# Patient Record
Sex: Male | Born: 1955 | Race: White | Hispanic: No | Smoking: Former smoker
Health system: Southern US, Community
[De-identification: ages and names within clinical notes are randomized; demographics above are authoritative.]

## PROBLEM LIST (undated history)

## (undated) DIAGNOSIS — F329 Major depressive disorder, single episode, unspecified: Secondary | ICD-10-CM

## (undated) DIAGNOSIS — J449 Chronic obstructive pulmonary disease, unspecified: Secondary | ICD-10-CM

## (undated) DIAGNOSIS — Z758 Other problems related to medical facilities and other health care: Secondary | ICD-10-CM

## (undated) DIAGNOSIS — A6000 Herpesviral infection of urogenital system, unspecified: Secondary | ICD-10-CM

## (undated) DIAGNOSIS — G40909 Epilepsy, unspecified, not intractable, without status epilepticus: Secondary | ICD-10-CM

## (undated) DIAGNOSIS — F32A Depression, unspecified: Secondary | ICD-10-CM

## (undated) HISTORY — DX: Major depressive disorder, single episode, unspecified: F32.9

## (undated) HISTORY — PX: VASECTOMY: SHX75

## (undated) HISTORY — DX: Depression, unspecified: F32.A

## (undated) HISTORY — PX: ROTATOR CUFF REPAIR: SHX139

## (undated) HISTORY — DX: Herpesviral infection of urogenital system, unspecified: A60.00

## (undated) HISTORY — DX: Epilepsy, unspecified, not intractable, without status epilepticus: G40.909

## (undated) HISTORY — PX: LITHOTRIPSY: SUR834

## (undated) HISTORY — DX: Other problems related to medical facilities and other health care: Z75.8

---

## 2008-03-22 ENCOUNTER — Emergency Department (HOSPITAL_COMMUNITY): Admission: EM | Admit: 2008-03-22 | Discharge: 2008-03-22 | Payer: Self-pay | Admitting: Emergency Medicine

## 2008-03-24 ENCOUNTER — Ambulatory Visit (HOSPITAL_COMMUNITY): Admission: RE | Admit: 2008-03-24 | Discharge: 2008-03-24 | Payer: Self-pay | Admitting: General Surgery

## 2008-03-24 ENCOUNTER — Encounter (INDEPENDENT_AMBULATORY_CARE_PROVIDER_SITE_OTHER): Payer: Self-pay | Admitting: General Surgery

## 2008-05-08 ENCOUNTER — Emergency Department (HOSPITAL_COMMUNITY): Admission: EM | Admit: 2008-05-08 | Discharge: 2008-05-08 | Payer: Self-pay | Admitting: Emergency Medicine

## 2008-12-13 ENCOUNTER — Encounter: Payer: Self-pay | Admitting: Cardiovascular Disease

## 2010-01-16 ENCOUNTER — Emergency Department (HOSPITAL_COMMUNITY): Admission: EM | Admit: 2010-01-16 | Discharge: 2010-01-16 | Payer: Self-pay | Admitting: Emergency Medicine

## 2011-01-26 LAB — URINE MICROSCOPIC-ADD ON

## 2011-01-26 LAB — URINALYSIS, ROUTINE W REFLEX MICROSCOPIC
Leukocytes, UA: NEGATIVE
Nitrite: NEGATIVE
Specific Gravity, Urine: 1.02 (ref 1.005–1.030)
pH: 6.5 (ref 5.0–8.0)

## 2011-03-17 ENCOUNTER — Emergency Department (HOSPITAL_COMMUNITY): Payer: Medicaid Other

## 2011-03-17 ENCOUNTER — Emergency Department (HOSPITAL_COMMUNITY)
Admission: EM | Admit: 2011-03-17 | Discharge: 2011-03-17 | Disposition: A | Discharge status: Home / Self Care | Payer: Medicaid Other | Attending: Emergency Medicine | Admitting: Emergency Medicine

## 2011-03-17 DIAGNOSIS — R319 Hematuria, unspecified: Secondary | ICD-10-CM | POA: Insufficient documentation

## 2011-03-17 DIAGNOSIS — R109 Unspecified abdominal pain: Secondary | ICD-10-CM | POA: Insufficient documentation

## 2011-03-17 DIAGNOSIS — M549 Dorsalgia, unspecified: Secondary | ICD-10-CM | POA: Insufficient documentation

## 2011-03-17 DIAGNOSIS — N200 Calculus of kidney: Secondary | ICD-10-CM | POA: Insufficient documentation

## 2011-03-17 LAB — DIFFERENTIAL
Basophils Relative: 0 % (ref 0–1)
Lymphs Abs: 2.1 10*3/uL (ref 0.7–4.0)
Monocytes Absolute: 1 10*3/uL (ref 0.1–1.0)
Monocytes Relative: 13 % — ABNORMAL HIGH (ref 3–12)
Neutro Abs: 4.3 10*3/uL (ref 1.7–7.7)

## 2011-03-17 LAB — COMPREHENSIVE METABOLIC PANEL
BUN: 11 mg/dL (ref 6–23)
CO2: 29 mEq/L (ref 19–32)
Calcium: 10 mg/dL (ref 8.4–10.5)
Chloride: 104 mEq/L (ref 96–112)
Creatinine, Ser: 0.81 mg/dL (ref 0.4–1.5)
GFR calc Af Amer: >60 mL/min (ref >60)
GFR calc non Af Amer: >60 mL/min (ref >60)
Glucose, Bld: 109 mg/dL — ABNORMAL HIGH (ref 70–99)
Total Bilirubin: 0.6 mg/dL (ref 0.3–1.2)

## 2011-03-17 LAB — URINALYSIS, ROUTINE W REFLEX MICROSCOPIC
Bilirubin Urine: NEGATIVE
Glucose, UA: NEGATIVE mg/dL
Hgb urine dipstick: NEGATIVE
Specific Gravity, Urine: 1.005 (ref 1.005–1.030)
Urobilinogen, UA: 0.2 mg/dL (ref 0.0–1.0)

## 2011-03-17 LAB — CBC
HCT: 40.8 % (ref 39.0–52.0)
Hemoglobin: 13.9 g/dL (ref 13.0–17.0)
MCH: 30.6 pg (ref 26.0–34.0)
MCHC: 34.1 g/dL (ref 30.0–36.0)
MCV: 89.9 fL (ref 78.0–100.0)

## 2011-03-17 LAB — LIPASE, BLOOD: Lipase: 36 U/L (ref 11–59)

## 2011-03-18 NOTE — Op Note (Signed)
NAME:  Johnathan Barber, Johnathan Barber NO.:  [id number]   MEDICAL RECORD NO.:  [id number]          PATIENT TYPE:  AMB   LOCATION:  DAY                           FACILITY:  [facility]   PHYSICIAN:  Tilford Pillar, MD      DATE OF BIRTH:  Jan 19, 1956   DATE OF PROCEDURE:  03/24/2008  DATE OF DISCHARGE:                               OPERATIVE REPORT   POSTOPERATIVE DIAGNOSIS:  Lymphadenopathy.   POSTOPERATIVE DIAGNOSIS:  Lymphadenopathy.   PROCEDURE:  Right inguinal lymph node biopsy.   SURGEON:  Tilford Pillar, MD   ANESTHESIA:  General endotracheal, local anesthetic 1% lidocaine plain.   SPECIMEN:  Inguinal lymph nodes x2.   ESTIMATED BLOOD LOSS:  Scant.   INDICATIONS:  The patient is a 55 year old who presented to my office on  referral from the emergency department by Dr. Mariel Sleet for noted  history of cervical and inguinal lymphadenopathy.  He does not have a  significant family history of hematologic cancers or disorders that he  is aware of.  He has had a recent history over the last month with a  tick bite and associated rash, but otherwise his history is relatively  unremarkable for possibility of allergies or lymphadenopathy.  He has  complained of decreased energy, but has not had any significant weight  loss over the last month and a half during the presence of this  pronounced cervical and inguinal lymphadenopathy.  Based on this, it was  recommended that a lymph node biopsy be performed.  The risks, benefits,  and alternatives of a right inguinal lymph node biopsy were discussed in  length with the patient including, but not limited to, risk of bleeding,  infection, as well as the possibility of an intraoperative cardiac or  pulmonary events.  The patient's questions and concerns were addressed.  The patient consented for the planned procedure.   OPERATION IN DETAIL:  The patient was taken to the operating room and  placed in a supine position on the operating room  table, at which time  general anesthetic was administered.  Once the patient was successfully  endotracheally intubated by Anesthesia.  At this point, the patient's  right groin was prepped and draped in the usual fashion.  Skin incision  was made with a scalpel.  Additional dissection down through the  subcuticular tissue was carried out using electrocautery.  I did then  enter into the Scarpa's fascia and identified several enlarged inguinal  lymph nodes.  Combination of blunt and sharp dissection as well as  electrocautery dissection was utilized to dissect the lymph nodes free.  Two separate lymph nodes were obtained.  We will send this fresh  specimen to pathology.  At this point, the wound was irrigated.  Hemostasis was obtained with electrocautery.  The Scarpa's fascia was  reapproximated using a 3-0 Vicryl in running continuous fashion.  Local  anesthetic was then instilled.  The skin edges were reapproximated using  a 4-0 Monocryl in a running subcuticular suture.  The skin was washed  and dried with a moist and dry towel.  Benzoin was applied around  the  incision.  Half-inch Steri-Strips were placed.  Drapes  were removed.  The patient was allowed to come out of the general  anesthetic and was transferred back to regular hospital bed.  He was  transferred to the Post-Anesthetic Care Unit in a stable condition.  At  the conclusion of the procedure, all instrument, sponge, and needle  counts were correct.  The patient tolerated the procedure well.      Tilford Pillar, MD  Electronically Signed     BZ/MEDQ  D:  03/24/2008  T:  03/25/2008  Job:  [id number]   cc:   Ladona Horns. Mariel Sleet, MD  Fax: [phone]
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
## 2011-07-30 LAB — DIFFERENTIAL
Basophils Relative: 1
Eosinophils Absolute: 0.2
Eosinophils Relative: 4
Lymphs Abs: 1.9
Monocytes Relative: 7
Neutrophils Relative %: 50

## 2011-07-30 LAB — COMPREHENSIVE METABOLIC PANEL
ALT: 11
AST: 14
Alkaline Phosphatase: 97
CO2: 27
Calcium: 9.1
GFR calc Af Amer: >60
GFR calc non Af Amer: >60
Glucose, Bld: 99
Potassium: 4.4
Sodium: 138

## 2011-07-30 LAB — CBC
Hemoglobin: 14.4
RBC: 4.51
WBC: 5

## 2011-07-30 LAB — BETA 2 MICROGLOBULIN, SERUM: Beta-2 Microglobulin: 1.27

## 2011-07-30 LAB — LACTATE DEHYDROGENASE: LDH: 101

## 2013-12-22 DIAGNOSIS — N200 Calculus of kidney: Secondary | ICD-10-CM | POA: Insufficient documentation

## 2017-01-06 ENCOUNTER — Encounter: Payer: Self-pay | Admitting: Cardiovascular Disease

## 2017-01-26 ENCOUNTER — Encounter: Payer: Self-pay | Admitting: *Deleted

## 2017-01-27 ENCOUNTER — Telehealth: Payer: Self-pay | Admitting: Cardiovascular Disease

## 2017-01-27 ENCOUNTER — Ambulatory Visit (INDEPENDENT_AMBULATORY_CARE_PROVIDER_SITE_OTHER): Payer: Medicaid Other | Admitting: Cardiovascular Disease

## 2017-01-27 ENCOUNTER — Encounter: Payer: Self-pay | Admitting: Cardiovascular Disease

## 2017-01-27 VITALS — BP 135/86 | HR 60 | Ht 67.0 in | Wt 129.0 lb

## 2017-01-27 DIAGNOSIS — R002 Palpitations: Secondary | ICD-10-CM | POA: Diagnosis not present

## 2017-01-27 DIAGNOSIS — R079 Chest pain, unspecified: Secondary | ICD-10-CM

## 2017-01-27 DIAGNOSIS — Z720 Tobacco use: Secondary | ICD-10-CM | POA: Diagnosis not present

## 2017-01-27 DIAGNOSIS — R55 Syncope and collapse: Secondary | ICD-10-CM | POA: Diagnosis not present

## 2017-01-27 DIAGNOSIS — G4489 Other headache syndrome: Secondary | ICD-10-CM | POA: Diagnosis not present

## 2017-01-27 NOTE — Telephone Encounter (Signed)
CT of HEad scheduled for March 29 arrive at 645 pm [facility]

## 2017-01-27 NOTE — Telephone Encounter (Signed)
Left message for patient to call back to get appointment for CT scheduled for march 29 arrive at 645 pm appointment at [facility]
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
## 2017-01-27 NOTE — Patient Instructions (Addendum)
Medication Instructions:  Continue all current medications.  Labwork: BMET - order given today.    Testing/Procedures:  Your physician has recommended that you wear a 14 day event monitor. Event monitors are medical devices that record the heart's electrical activity. Doctors most often us these monitors to diagnose arrhythmias. Arrhythmias are problems with the speed or rhythm of the heartbeat. The monitor is a small, portable device. You can wear one while you do your normal daily activities. This is usually used to diagnose what is causing palpitations/syncope (passing out).  (Head CT) Non-Cardiac CT scanning, (CAT scanning), is a noninvasive, special x-ray that produces cross-sectional images of the body using x-rays and a computer. CT scans help physicians diagnose and treat medical conditions. For some CT exams, a contrast material is used to enhance visibility in the area of the body being studied. CT scans provide greater clarity and reveal more details than regular x-ray exams.  Office will contact with results via phone or letter.    Follow-Up: 2 months   Any Other Special Instructions Will Be Listed Below (If Applicable).  If you need a refill on your cardiac medications before your next appointment, please call your pharmacy.

## 2017-01-28 ENCOUNTER — Telehealth: Payer: Self-pay | Admitting: *Deleted

## 2017-01-28 NOTE — Addendum Note (Signed)
Addended by: Burman NievesASHWORTH, Nhia Heaphy T on: 01/28/2017 07:34 AM   Modules accepted: Orders

## 2017-01-28 NOTE — Telephone Encounter (Signed)
Notes recorded by Lesle ChrisAngela G Carloyn Lahue, LPN on 9/14/78293/28/2018 at 3:56 PM EDT Left message to return call.  ------  Notes recorded by Laqueta LindenSuresh A Koneswaran, MD on 01/28/2017 at 12:35 PM EDT Good.

## 2017-01-29 ENCOUNTER — Ambulatory Visit (HOSPITAL_COMMUNITY)
Admission: RE | Admit: 2017-01-29 | Discharge: 2017-01-29 | Disposition: A | Discharge status: Home / Self Care | Payer: Medicaid Other | Source: Ambulatory Visit | Attending: Cardiovascular Disease | Admitting: Cardiovascular Disease

## 2017-01-29 ENCOUNTER — Encounter: Payer: Self-pay | Admitting: *Deleted

## 2017-01-29 DIAGNOSIS — G4489 Other headache syndrome: Secondary | ICD-10-CM | POA: Diagnosis present

## 2017-01-29 DIAGNOSIS — R55 Syncope and collapse: Secondary | ICD-10-CM | POA: Diagnosis not present

## 2017-01-29 NOTE — Telephone Encounter (Signed)
Notes recorded by Ravin Denardo G Aikam Vinje, LPN on 1/61/09603/29/2018 atLesle Chris 5:28 PM EDT Called patient again for test results. Mother answered phone, but did not see designated release with her name on it. Patient going tonight for his head CT. Will mail letter in regards to normal labs. ------  Notes recorded by Lesle ChrisAngela G Kinsley Holderman, LPN on 4/54/09813/28/2018 at 3:56 PM EDT Left message to return call.  ------  Notes recorded by Laqueta LindenSuresh A Koneswaran, MD on 01/28/2017 at 12:35 PM EDT Good.

## 2017-01-29 NOTE — Telephone Encounter (Signed)
Johnathan Barber (mother) returned call to [place].

## 2017-02-02 ENCOUNTER — Telehealth: Payer: Self-pay | Admitting: *Deleted

## 2017-02-02 NOTE — Telephone Encounter (Signed)
Notes recorded by Lesle Chris, LPN on 11/08/1094 at 8:43 AM EDT Left message to return call with mother.   ------  Notes recorded by Laqueta Linden, MD on 01/30/2017 at 9:59 AM EDT normal

## 2017-02-02 NOTE — Telephone Encounter (Signed)
Notes recorded by Lesle Chris, LPN on 05/11/2955 at 4:54 PM EDT Patient notified. Copy to pmd.

## 2017-02-04 ENCOUNTER — Telehealth: Payer: Self-pay | Admitting: Cardiovascular Disease

## 2017-02-04 NOTE — Telephone Encounter (Signed)
Checked [employer] site, but will check with nurse to clarify

## 2017-02-04 NOTE — Telephone Encounter (Signed)
Has not received his monitor   He would like to know where it is.

## 2017-02-05 NOTE — Telephone Encounter (Signed)
Delores (mother) notified.  Order placed.  Should be hearing from [employer] in a few days.

## 2017-02-13 ENCOUNTER — Ambulatory Visit (INDEPENDENT_AMBULATORY_CARE_PROVIDER_SITE_OTHER): Payer: Medicaid Other

## 2017-02-13 DIAGNOSIS — R002 Palpitations: Secondary | ICD-10-CM | POA: Diagnosis not present

## 2017-03-10 ENCOUNTER — Ambulatory Visit: Payer: Medicaid Other | Admitting: Neurology

## 2017-03-18 ENCOUNTER — Encounter: Payer: Self-pay | Admitting: *Deleted

## 2017-03-31 ENCOUNTER — Encounter: Payer: Self-pay | Admitting: Cardiovascular Disease

## 2017-03-31 ENCOUNTER — Ambulatory Visit: Payer: Medicaid Other | Admitting: Cardiovascular Disease

## 2017-06-14 IMAGING — CT CT HEAD W/O CM
3 series · 16 of 47 positions shown, 19 images · non-contrast
Comparison: None.

CLINICAL DATA: Headaches

EXAM:
CT HEAD WITHOUT CONTRAST
TECHNIQUE: Contiguous axial images were obtained from the base of the skull
through the vertex without intravenous contrast.

[Series 2: head wo · axial · 0.43mm/px · z∈[+133,+268]mm · 10 of 33 slices shown, 13 images]
[im 3/33  brain]
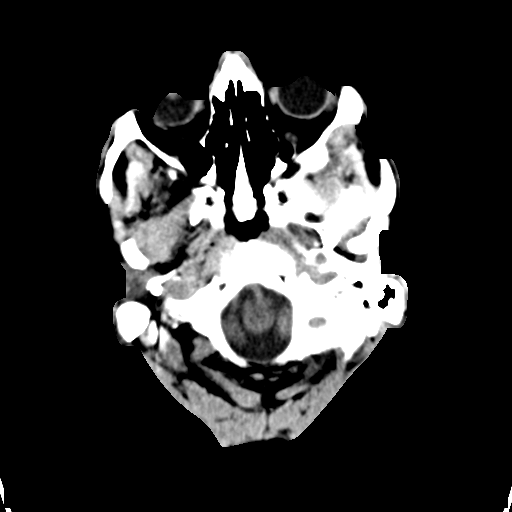
[im 3/33  bone]
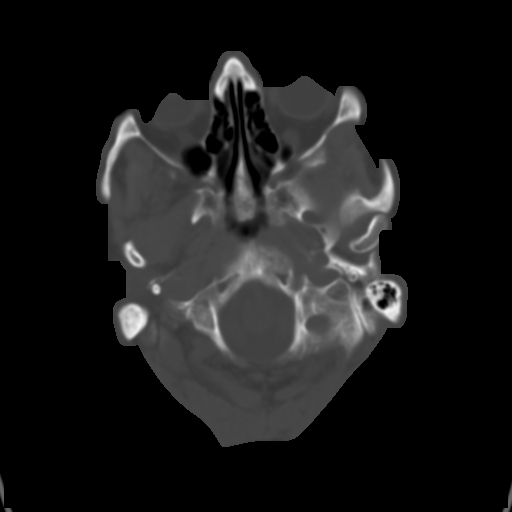
[im 6/33  brain]
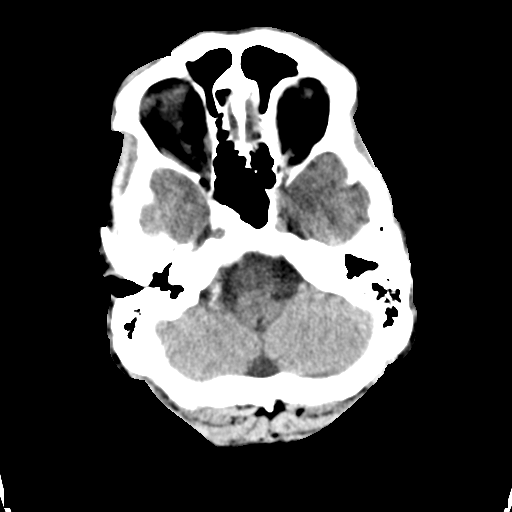
[im 9/33  brain]
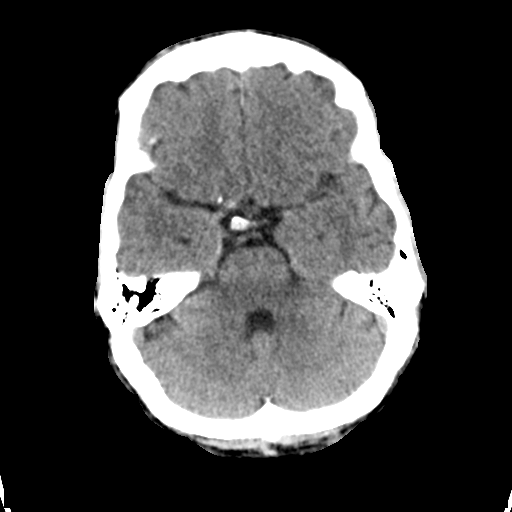
[im 12/33  brain]
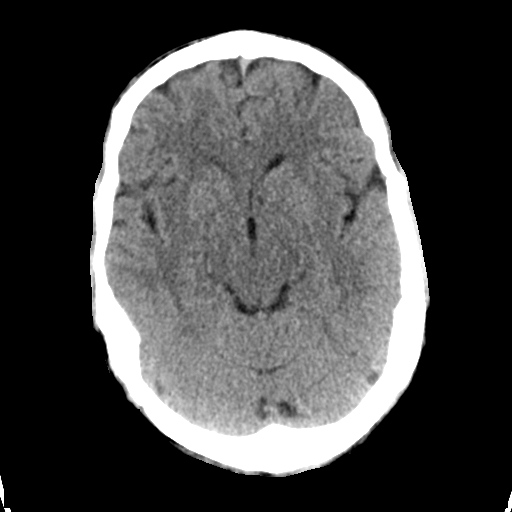
[im 15/33  brain]
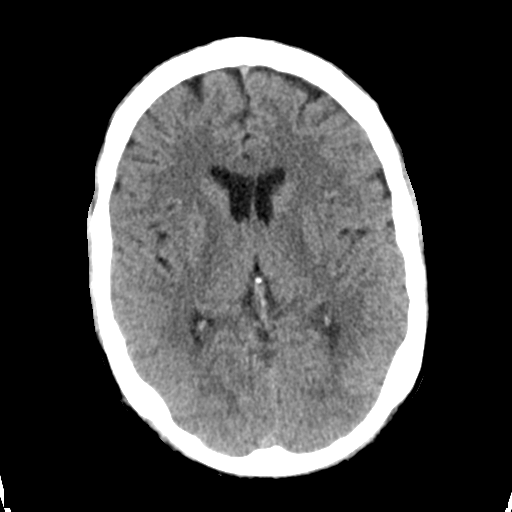
[im 15/33  bone]
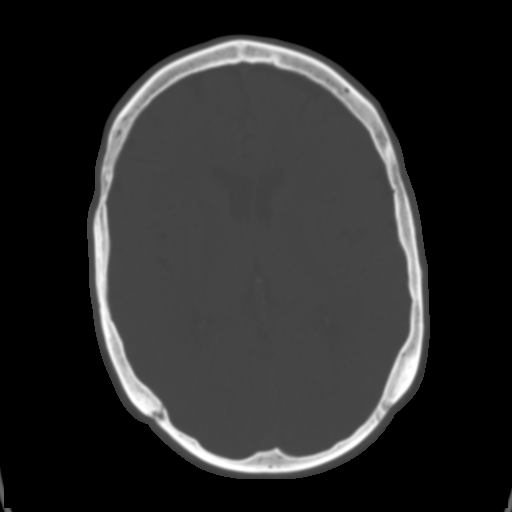
[im 18/33  brain]
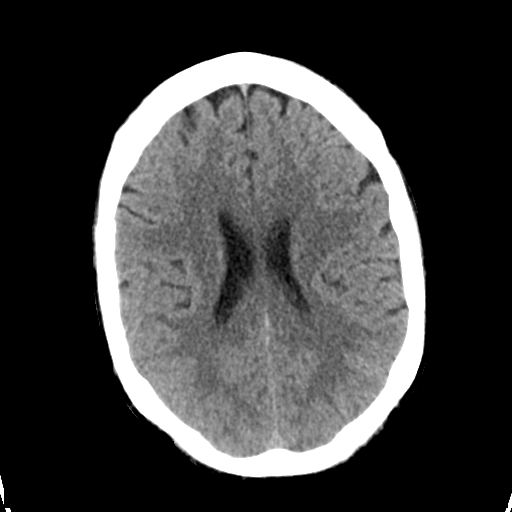
[im 21/33  brain]
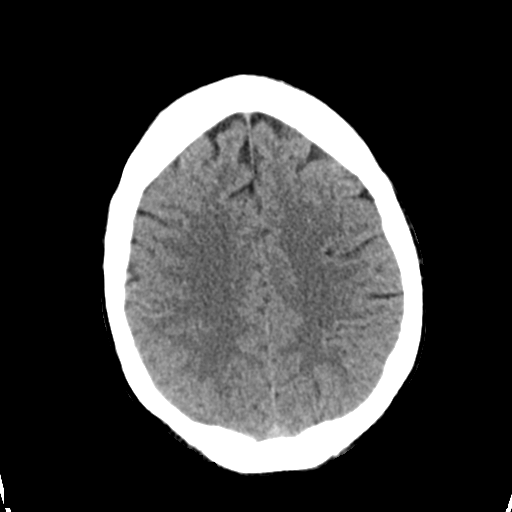
[im 25/33  brain]
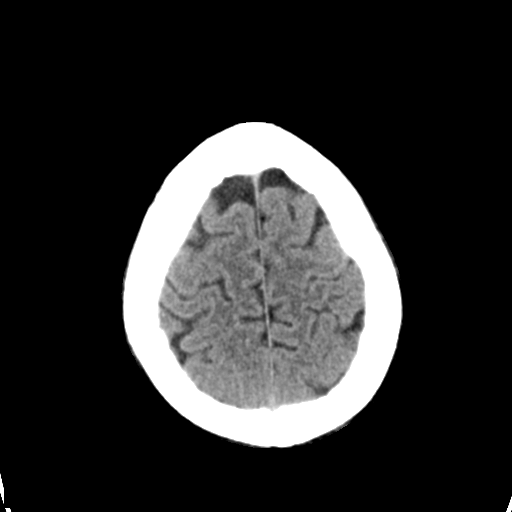
[im 27/33  brain]
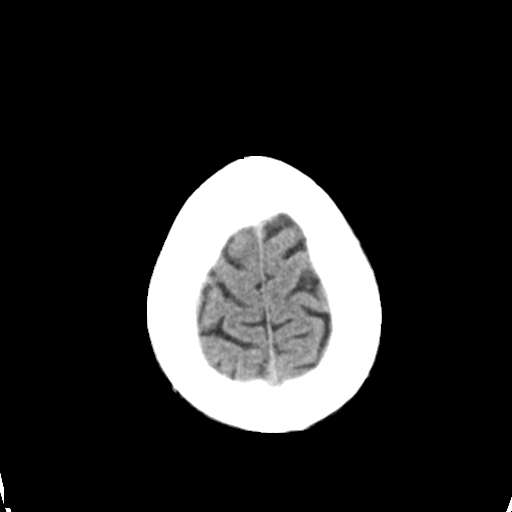
[im 27/33  bone]
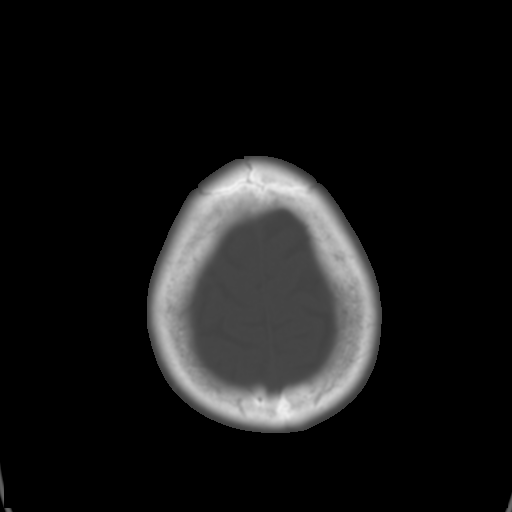
[im 30/33  brain]
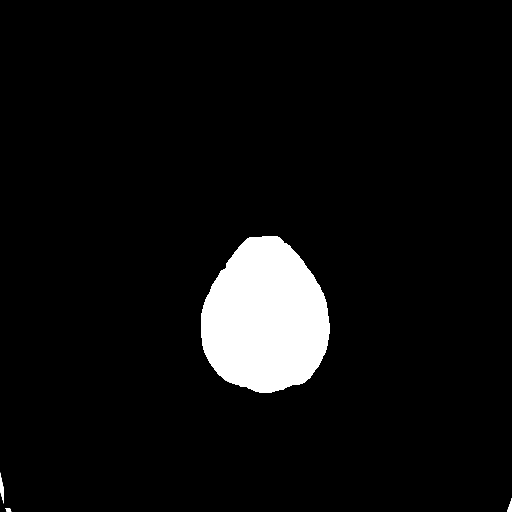

[Series 4: coronal soft tissue · coronal · 0.31mm/px · 3 of 67 slices shown]
[im 23/67  brain]
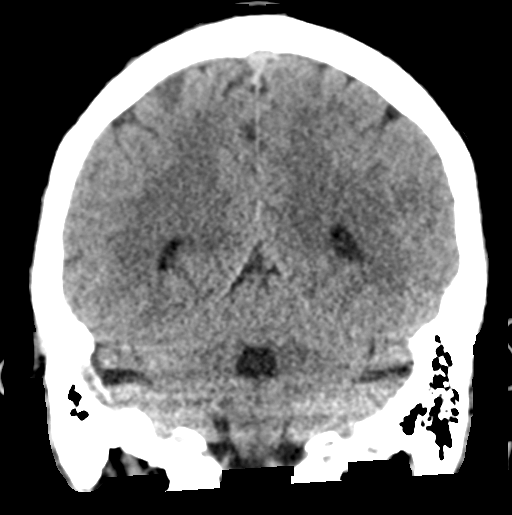
[im 30/67  brain]
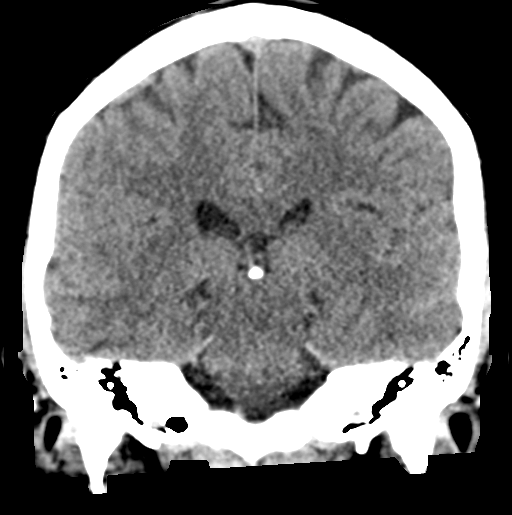
[im 37/67  brain]
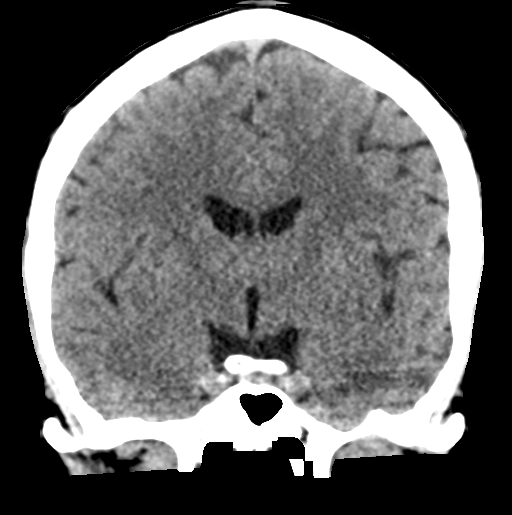

[Series 5: sagittal soft tissue · sagittal · 0.30mm/px · 3 of 50 slices shown]
[im 17/50  brain]
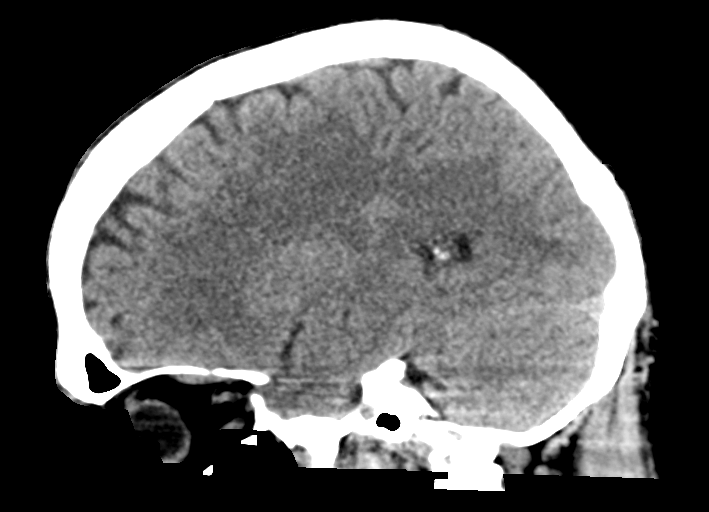
[im 25/50  brain]
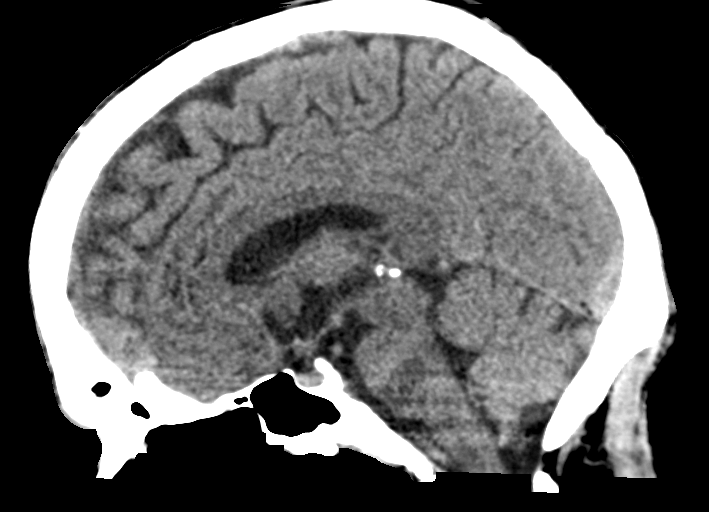
[im 33/50  brain]
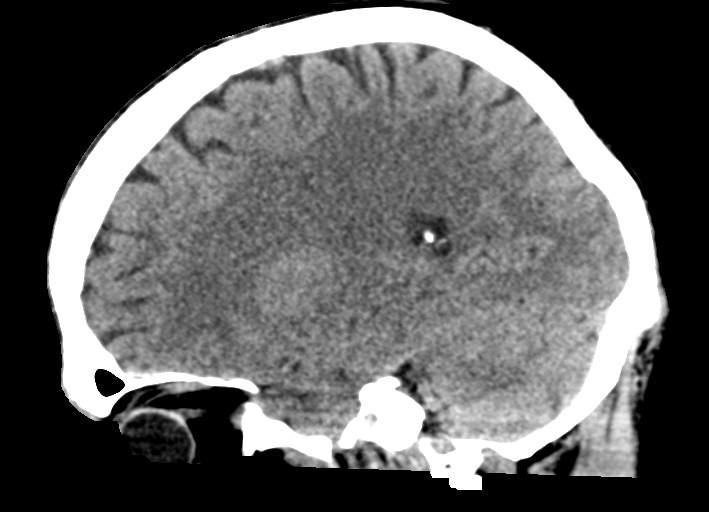

[16 of 47 positions shown; findings below may reference images not displayed]

FINDINGS: Brain: No evidence of acute infarction, hemorrhage, hydrocephalus,
extra-axial collection or mass lesion/mass effect.

Vascular: No hyperdense vessel or unexpected calcification.

Skull: Normal. Negative for fracture or focal lesion.

Sinuses/Orbits: No acute finding.

Other: None.
IMPRESSION: No acute intracranial abnormality noted.

## 2018-10-08 DIAGNOSIS — Z720 Tobacco use: Secondary | ICD-10-CM | POA: Insufficient documentation

## 2018-10-08 DIAGNOSIS — K920 Hematemesis: Secondary | ICD-10-CM | POA: Insufficient documentation

## 2022-11-04 ENCOUNTER — Encounter (HOSPITAL_COMMUNITY): Payer: Self-pay | Admitting: Emergency Medicine

## 2022-11-04 ENCOUNTER — Other Ambulatory Visit: Payer: Self-pay

## 2022-11-04 ENCOUNTER — Emergency Department (HOSPITAL_COMMUNITY)
Admission: EM | Admit: 2022-11-04 | Discharge: 2022-11-04 | Disposition: A | Discharge status: Home / Self Care | Payer: Medicare Other | Attending: Emergency Medicine | Admitting: Emergency Medicine

## 2022-11-04 ENCOUNTER — Emergency Department (HOSPITAL_COMMUNITY): Payer: Medicare Other

## 2022-11-04 DIAGNOSIS — Z7901 Long term (current) use of anticoagulants: Secondary | ICD-10-CM | POA: Diagnosis not present

## 2022-11-04 DIAGNOSIS — R079 Chest pain, unspecified: Secondary | ICD-10-CM | POA: Insufficient documentation

## 2022-11-04 DIAGNOSIS — J449 Chronic obstructive pulmonary disease, unspecified: Secondary | ICD-10-CM | POA: Insufficient documentation

## 2022-11-04 DIAGNOSIS — Z87891 Personal history of nicotine dependence: Secondary | ICD-10-CM | POA: Insufficient documentation

## 2022-11-04 HISTORY — DX: Chronic obstructive pulmonary disease, unspecified: J44.9

## 2022-11-04 LAB — BASIC METABOLIC PANEL
Anion gap: 9 (ref 5–15)
BUN: 11 mg/dL (ref 8–23)
CO2: 25 mmol/L (ref 22–32)
Calcium: 9.1 mg/dL (ref 8.9–10.3)
Chloride: 102 mmol/L (ref 98–111)
Creatinine, Ser: 0.92 mg/dL (ref 0.61–1.24)
GFR, Estimated: >60 mL/min (ref >60)
Glucose, Bld: 98 mg/dL (ref 70–99)
Potassium: 4 mmol/L (ref 3.5–5.1)
Sodium: 136 mmol/L (ref 135–145)

## 2022-11-04 LAB — D-DIMER, QUANTITATIVE: D-Dimer, Quant: <0.27 ug/mL-FEU (ref 0.00–0.50)

## 2022-11-04 LAB — CBC
HCT: 43.9 % (ref 39.0–52.0)
Hemoglobin: 15.2 g/dL (ref 13.0–17.0)
MCH: 31 pg (ref 26.0–34.0)
MCHC: 34.6 g/dL (ref 30.0–36.0)
MCV: 89.6 fL (ref 80.0–100.0)
Platelets: 244 10*3/uL (ref 150–400)
RBC: 4.9 MIL/uL (ref 4.22–5.81)
RDW: 13.8 % (ref 11.5–15.5)
WBC: 6.6 10*3/uL (ref 4.0–10.5)
nRBC: 0 % (ref 0.0–0.2)

## 2022-11-04 LAB — TROPONIN I (HIGH SENSITIVITY)
Troponin I (High Sensitivity): 2 ng/L (ref <18)
Troponin I (High Sensitivity): 3 ng/L (ref <18)

## 2022-11-04 NOTE — ED Triage Notes (Signed)
Pt sent by PCP for chest pain x 2 days, pt c/o left sided chest pain with "electrical shock pain to left side and left arm", right sided chest pain that feels like stabbing pain

## 2022-11-04 NOTE — ED Provider Notes (Signed)
[facility] Provider Note   CSN: [id number] Arrival date & time: 11/04/22  1023     History  Chief Complaint  Patient presents with   Chest Pain    Johnathan Barber is a 67 y.o. male.  67 year old male with a history of prior tobacco use, COPD not on home oxygen, and family history of MI who presents emergency department with chest pain.  Patient describes left-sided electric shocklike sensation in his left chest.  Says that yesterday it was also associated with some shortness of breath and a stabbing sensation in his right chest.  Says that the sensation improves with exertion.  Did have an episode of vomiting with it last night.  No diaphoresis.  Reports that his left-sided chest pain is pleuritic.  Does radiate down his left arm.  Unsure if it is from his neck but does have a history of degenerative disc disease.  Reports that father had an MI before the age of 59.  Had a stress test 1 year ago that was normal.  Is currently undergoing a workup for right lung mass that they are concerned about possible lung cancer with.  Denies any fever or worsening cough.       Home Medications Prior to Admission medications   Medication Sig Start Date End Date Taking? Authorizing Provider  rivaroxaban (XARELTO) 20 MG TABS tablet Take 20 mg by mouth daily with supper. Patient not taking: Reported on 11/04/2022    [provider]      Allergies    Codeine, Morphine, and Prednisone    Review of Systems   Review of Systems  Physical Exam Updated Vital Signs BP (!) 166/84 (BP Location: Right Arm)   Pulse 72   Temp 98.6 F (37 C) (Oral)   Resp 15   Ht 5\' 7"  (1.702 m)   Wt 60.6 kg   SpO2 100%   BMI 20.94 kg/m  Physical Exam Vitals and nursing note reviewed.  Constitutional:      General: He is not in acute distress.    Appearance: He is well-developed.  HENT:     Head: Normocephalic and atraumatic.     Right Ear: External ear normal.     Left Ear:  External ear normal.     Nose: Nose normal.  Eyes:     Extraocular Movements: Extraocular movements intact.     Conjunctiva/sclera: Conjunctivae normal.     Pupils: Pupils are equal, round, and reactive to light.  Cardiovascular:     Rate and Rhythm: Normal rate and regular rhythm.     Heart sounds: Normal heart sounds.     Comments: No rash on chest Pulmonary:     Effort: Pulmonary effort is normal. No respiratory distress.     Comments: Very faint expiratory wheezing.  Patient speaking in full sentences without difficulty.  No labored breathing. Abdominal:     General: There is no distension.     Palpations: Abdomen is soft. There is no mass.     Tenderness: There is no abdominal tenderness. There is no guarding.  Musculoskeletal:     Cervical back: Normal range of motion and neck supple.     Right lower leg: No edema.     Left lower leg: No edema.     Comments: Symmetrically palpable radial and ulnar pulses. Capillary refill <2 seconds to all digits.  Intact sensation to light Barber of the radial, median and ulnar nerves demonstrated by testing in the dorsal web  space of the thumb, the hypothenar eminence of the palm, and the radial aspect of the dorsum of the hand.  Intact motor function of the radial, median and ulnar nerves demonstrated by strength of hand grip, and spreading of the 2nd through 5th digits, thumb apposition, and ability to make "OK sign".  Skin:    General: Skin is warm and dry.  Neurological:     Mental Status: He is alert. Mental status is at baseline.  Psychiatric:        Mood and Affect: Mood normal.        Behavior: Behavior normal.     ED Results / Procedures / Treatments   Labs (all labs ordered are listed, but only abnormal results are displayed) Labs Reviewed  BASIC METABOLIC PANEL  CBC  D-DIMER, QUANTITATIVE  TROPONIN I (HIGH SENSITIVITY)  TROPONIN I (HIGH SENSITIVITY)    EKG EKG Interpretation  Date/Time:  Tuesday November 04 2022  11:18:05 EST Ventricular Rate:  83 PR Interval:  156 QRS Duration: 90 QT Interval:  364 QTC Calculation: 427 R Axis:   90 Text Interpretation: Normal sinus rhythm with sinus arrhythmia Right atrial enlargement Rightward axis Pulmonary disease pattern Minimal voltage criteria for LVH, may be normal variant ( Cornell product ) Abnormal ECG When compared with ECG of 23-Mar-2008 16:05, ST no longer elevated in Inferior leads T wave amplitude has increased in Anterior leads Confirmed by Margaretmary Eddy [id number]) on 11/04/2022 12:45:25 PM  Radiology DG Chest 2 View  Result Date: 11/04/2022 CLINICAL DATA:  Chest pain EXAM: CHEST - 2 VIEW COMPARISON:  CXR 02/10/22 FINDINGS: No pleural effusion. No pneumothorax. Unchanged cardiac and mediastinal contours. Redemonstrated is a focal somewhat spiculated opacity in the left midlung, not significantly changed from prior exam. Unchanged appearance of the left lung base with poor visualization of the left hemidiaphragm. No displaced rib fractures. Gaseous distended loops of bowel are seen in the left upper quadrant. IMPRESSION: No new radiographic finding to explain chest pain. Unchanged appearance of the left midlung, better assessed on prior CT chest from 02/10/2022. Electronically Signed   By: Marin Roberts M.D.   On: 11/04/2022 11:47    Procedures Procedures   Medications Ordered in ED Medications - No data to display  ED Course/ Medical Decision Making/ A&P                           Medical Decision Making Amount and/or Complexity of Data Reviewed Labs: ordered. Radiology: ordered.   Johnathan Barber is a 67 y.o. male with comorbidities that complicate the patient evaluation including lung mass undergoing evaluation, COPD, family history of MI, prior tobacco use who presents to the emergency department with left-sided chest pain  Initial Ddx:  Radiculopathy, MI, PE, pleurisy  MDM:  The patient's electrical shocklike chest pain is likely due to  radiculopathy given the description of pain and the fact that it radiates down his hand.  Not reporting any symptoms of spinal cord compression at this time.  This family history and history of smoking will obtain EKG and troponins for MI.  It was reassuring that he had a stress test that was WNL last year.  Also considering PE on the differential with a lung mass that is being worked up.  No significant infectious symptoms but also considering pleurisy so we will obtain a chest x-ray to evaluate for pneumonia or other cause.  Plan:  Labs Troponin D-dimer EKG Chest x-ray  ED Summary/Re-evaluation:  Patient underwent the above workup that was reassuring.  Serial troponins were WNL without significant elevation or change.  Chest x-ray showed previously seen mass that patient is aware of.  D-dimer was within age-adjusted limits so do not feel that CTA is indicated.  Offered admission to the patient since I calculated heart score of 6.  Patient preferred instead to undergo outpatient evaluation with cardiology.  I do feel that this is appropriate especially with his recent normal stress test and his symptoms not being clearly consistent with angina.  Will also have him follow-up with his primary doctor in 2 to 3 days.  Offered the patient pain medication but he declined.  This patient presents to the ED for concern of complaints listed in HPI, this involves an extensive number of treatment options, and is a complaint that carries with it a high risk of complications and morbidity. Disposition including potential need for admission considered.   Dispo: DC Home. Return precautions discussed including, but not limited to, those listed in the AVS. Allowed pt time to ask questions which were answered fully prior to dc.   Records reviewed Outpatient Clinic Notes The following labs were independently interpreted: Chemistry and Serial Troponins and show no acute abnormality I independently reviewed the  following imaging with scope of interpretation limited to determining acute life threatening conditions related to emergency care: Chest x-ray and agree with the radiologist interpretation with the following exceptions: None I personally reviewed and interpreted cardiac monitoring: normal sinus rhythm  I personally reviewed and interpreted the pt's EKG: see above for interpretation  I have reviewed the patients home medications and made adjustments as needed  Final Clinical Impression(s) / ED Diagnoses Final diagnoses:  Chest pain, unspecified type    Rx / DC Orders ED Discharge Orders          Ordered    Ambulatory referral to Cardiology       Comments: Chest pain. Possible stress test   11/04/22 1716              Fransico Meadow, MD 11/04/22 [phone]

## 2022-11-04 NOTE — Discharge Instructions (Signed)
You were seen for your chest pain in the emergency department.   Follow-up with your primary doctor in 2-3 days regarding your visit.  A referral was placed for cardiology so please follow-up with them if they do not contact you within 72 hours.  Return immediately to the emergency department if you experience any of the following: Worsening chest pain, vomiting, unexplained sweating, or any other concerning symptoms.    Thank you for visiting our Emergency Department. It was a pleasure taking care of you today.

## 2022-11-06 ENCOUNTER — Other Ambulatory Visit: Payer: Self-pay | Admitting: Internal Medicine

## 2022-11-06 ENCOUNTER — Encounter: Payer: Self-pay | Admitting: Internal Medicine

## 2022-11-06 ENCOUNTER — Ambulatory Visit: Payer: Medicare Other | Attending: Internal Medicine | Admitting: Internal Medicine

## 2022-11-06 VITALS — BP 146/86 | HR 77 | Ht 67.75 in | Wt 133.2 lb

## 2022-11-06 DIAGNOSIS — R079 Chest pain, unspecified: Secondary | ICD-10-CM | POA: Insufficient documentation

## 2022-11-06 DIAGNOSIS — I739 Peripheral vascular disease, unspecified: Secondary | ICD-10-CM | POA: Diagnosis not present

## 2022-11-06 DIAGNOSIS — I200 Unstable angina: Secondary | ICD-10-CM | POA: Diagnosis not present

## 2022-11-06 MED ORDER — RAMIPRIL 5 MG PO CAPS: 5.0000 mg | ORAL_CAPSULE | Freq: Every day | ORAL | 3 refills | Status: DC

## 2022-11-06 MED ORDER — ASPIRIN 81 MG PO TBEC: 81.0000 mg | DELAYED_RELEASE_TABLET | Freq: Every day | ORAL | 3 refills | Status: DC

## 2022-11-06 MED ORDER — METOPROLOL TARTRATE 25 MG PO TABS: 25.0000 mg | ORAL_TABLET | Freq: Two times a day (BID) | ORAL | 3 refills | Status: DC
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
## 2022-11-06 NOTE — Patient Instructions (Addendum)
Medication Instructions:  Your physician has recommended you make the following change in your medication:  Start metoprolol tartrate 25 mg twice daily Start aspirin 81 mg daily Start ramipril 5 mg daily Continue other medications the same  Labwork: none  Testing/Procedures: Your physician has requested that you have an echocardiogram. Echocardiography is a painless test that uses sound waves to create images of your heart. It provides your doctor with information about the size and shape of your heart and how well your heart's chambers and valves are working. This procedure takes approximately one hour. There are no restrictions for this procedure. Please do NOT wear cologne, perfume, aftershave, or lotions (deodorant is allowed). Please arrive 15 minutes prior to your appointment time. Your physician has requested that you have a lower extremity arterial exercise duplex with ABI's. During this test, exercise and ultrasound are used to evaluate arterial blood flow in the legs. Allow one hour for this exam. There are no restrictions or special instructions. Coronary CTA-see instructions below  Follow-Up: Your physician recommends that you schedule a follow-up appointment in: 3 months  Any Other Special Instructions Will Be Listed Below (If Applicable).  If you need a refill on your cardiac medications before your next appointment, please call your pharmacy.    Your cardiac CT will be scheduled at one of the below locations:   [facility] [address] [place], [place] [address] [phone]  If scheduled at [facility], please arrive at the [facility] En[facility] (Entrance C2) of [facility] 30 minutes prior to test start time. You can use the FREE valet parking offered at entrance C (encouraged to control the heart rate for the test)  Proceed to the [facility] Radiology Department (first floor) to check-in and test prep.  All radiology  patients and guests should use entrance C2 at [facility], accessed from [facility], even though the hospital's physical address listed is [address].  Please follow these instructions carefully (unless otherwise directed):  Hold all erectile dysfunction medications at least 3 days (72 hrs) prior to test. (Ie viagra, cialis, sildenafil, tadalafil, etc) We will administer nitroglycerin during this exam.   On the Night Before the Test: Be sure to Drink plenty of water. Do not consume any caffeinated/decaffeinated beverages or chocolate 12 hours prior to your test. Do not take any antihistamines 12 hours prior to your test. If the patient has contrast allergy:  On the Day of the Test: Drink plenty of water until 1 hour prior to the test. Do not eat any food 1 hour prior to test. You may take your regular medications prior to the test.  Take metoprolol 100 mg (Lopressor) two hours prior to test. (4 of your 25 mg tablets)      After the Test: Drink plenty of water. After receiving IV contrast, you may experience a mild flushed feeling. This is normal. On occasion, you may experience a mild rash up to 24 hours after the test. This is not dangerous. If this occurs, you can take Benadryl 25 mg and increase your fluid intake. If you experience trouble breathing, this can be serious. If it is severe call 911 IMMEDIATELY. If it is mild, please call our office.  We will call to schedule your test 2-4 weeks out understanding that some insurance companies will need an authorization prior to the service being performed.   For non-scheduling related questions, please contact the cardiac imaging nurse navigator  should you have any questions/concerns: Marchia Bond, Cardiac Imaging Nurse Navigator Gordy Clement, Cardiac Imaging Nurse Navigator [facility] Direct Office Dial: [phone]   For scheduling needs, including cancellations and  rescheduling, please call [place], [phone].

## 2022-11-07 ENCOUNTER — Telehealth (HOSPITAL_COMMUNITY): Payer: Self-pay | Admitting: *Deleted

## 2022-11-07 NOTE — Telephone Encounter (Signed)
Reaching out to patient to offer assistance regarding upcoming cardiac imaging study; pt verbalizes understanding of appt date/time, parking situation and where to check in, pre-test NPO status and medications ordered, and verified current allergies; name and call back number provided for further questions should they arise  Gordy Clement RN Navigator Cardiac Imaging [facility] [phone] office [phone] cell  Patient to take 100mg  metoprolol tartrate two hours prior his cardiac CT scan. He is aware to arrive at 1pm.

## 2022-11-10 ENCOUNTER — Ambulatory Visit (HOSPITAL_COMMUNITY)
Admission: RE | Admit: 2022-11-10 | Discharge: 2022-11-10 | Disposition: A | Discharge status: Home / Self Care | Payer: Medicare Other | Source: Ambulatory Visit | Attending: Internal Medicine | Admitting: Internal Medicine

## 2022-11-10 ENCOUNTER — Encounter (HOSPITAL_COMMUNITY): Payer: Self-pay

## 2022-11-10 DIAGNOSIS — I200 Unstable angina: Secondary | ICD-10-CM

## 2022-11-10 DIAGNOSIS — R079 Chest pain, unspecified: Secondary | ICD-10-CM | POA: Diagnosis present

## 2022-11-10 MED ORDER — NITROGLYCERIN 0.4 MG SL SUBL
0.8000 mg | SUBLINGUAL_TABLET | Freq: Once | SUBLINGUAL | Status: AC
  Administered 2022-11-10: 0.8 mg via SUBLINGUAL

## 2022-11-10 MED ORDER — NITROGLYCERIN 0.4 MG SL SUBL
SUBLINGUAL_TABLET | SUBLINGUAL | Status: AC
  Filled 2022-11-10: qty 2

## 2022-11-10 MED ORDER — IOHEXOL 350 MG/ML SOLN
95.0000 mL | Freq: Once | INTRAVENOUS | Status: AC | PRN
  Administered 2022-11-10: 95 mL via INTRAVENOUS

## 2022-11-11 ENCOUNTER — Ambulatory Visit: Payer: Medicare Other | Attending: Internal Medicine

## 2022-11-11 DIAGNOSIS — R079 Chest pain, unspecified: Secondary | ICD-10-CM

## 2022-11-11 LAB — ECHOCARDIOGRAM COMPLETE
AR max vel: 2.27 cm2
AV Area VTI: 2.47 cm2
AV Area mean vel: 2.45 cm2
AV Mean grad: 3.2 mmHg
AV Peak grad: 6.7 mmHg
Ao pk vel: 1.3 m/s
Area-P 1/2: 3.08 cm2
Calc EF: 68.5 %
MV M vel: 2.15 m/s
MV Peak grad: 18.4 mmHg
S' Lateral: 2.7 cm
Single Plane A2C EF: 70.3 %
Single Plane A4C EF: 67 %

## 2022-11-20 ENCOUNTER — Ambulatory Visit: Payer: Medicare Other
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
## 2022-12-03 ENCOUNTER — Institutional Professional Consult (permissible substitution): Payer: Medicaid Other | Admitting: Internal Medicine

## 2022-12-11 ENCOUNTER — Ambulatory Visit: Payer: 59 | Attending: Internal Medicine

## 2022-12-11 DIAGNOSIS — I739 Peripheral vascular disease, unspecified: Secondary | ICD-10-CM | POA: Diagnosis not present

## 2022-12-11 LAB — VAS US LOWER EXT ART SEG MULTI (SEGMENTALS & LE RAYNAUDS)
Left ABI: 1.24
Right ABI: 1.19

## 2022-12-17 ENCOUNTER — Encounter (HOSPITAL_COMMUNITY): Payer: Self-pay | Admitting: Emergency Medicine

## 2022-12-17 ENCOUNTER — Emergency Department (HOSPITAL_COMMUNITY)
Admission: EM | Admit: 2022-12-17 | Discharge: 2022-12-17 | Disposition: A | Discharge status: Home / Self Care | Payer: 59 | Attending: Emergency Medicine | Admitting: Emergency Medicine

## 2022-12-17 ENCOUNTER — Other Ambulatory Visit: Payer: Self-pay

## 2022-12-17 DIAGNOSIS — I10 Essential (primary) hypertension: Secondary | ICD-10-CM | POA: Diagnosis not present

## 2022-12-17 DIAGNOSIS — Z20822 Contact with and (suspected) exposure to covid-19: Secondary | ICD-10-CM | POA: Diagnosis not present

## 2022-12-17 DIAGNOSIS — Z79899 Other long term (current) drug therapy: Secondary | ICD-10-CM | POA: Diagnosis not present

## 2022-12-17 DIAGNOSIS — J449 Chronic obstructive pulmonary disease, unspecified: Secondary | ICD-10-CM | POA: Diagnosis not present

## 2022-12-17 DIAGNOSIS — R911 Solitary pulmonary nodule: Secondary | ICD-10-CM | POA: Diagnosis not present

## 2022-12-17 DIAGNOSIS — R001 Bradycardia, unspecified: Secondary | ICD-10-CM | POA: Diagnosis not present

## 2022-12-17 DIAGNOSIS — R0789 Other chest pain: Secondary | ICD-10-CM | POA: Diagnosis not present

## 2022-12-17 DIAGNOSIS — F32A Depression, unspecified: Secondary | ICD-10-CM | POA: Diagnosis present

## 2022-12-17 DIAGNOSIS — Z299 Encounter for prophylactic measures, unspecified: Secondary | ICD-10-CM | POA: Diagnosis not present

## 2022-12-17 DIAGNOSIS — G4700 Insomnia, unspecified: Secondary | ICD-10-CM | POA: Diagnosis not present

## 2022-12-17 DIAGNOSIS — F3181 Bipolar II disorder: Secondary | ICD-10-CM | POA: Diagnosis not present

## 2022-12-17 LAB — COMPREHENSIVE METABOLIC PANEL
ALT: 15 U/L (ref 0–44)
AST: 14 U/L — ABNORMAL LOW (ref 15–41)
Albumin: 3.7 g/dL (ref 3.5–5.0)
Alkaline Phosphatase: 81 U/L (ref 38–126)
Anion gap: 7 (ref 5–15)
BUN: 17 mg/dL (ref 8–23)
CO2: 25 mmol/L (ref 22–32)
Calcium: 8.6 mg/dL — ABNORMAL LOW (ref 8.9–10.3)
Chloride: 103 mmol/L (ref 98–111)
Creatinine, Ser: 0.87 mg/dL (ref 0.61–1.24)
GFR, Estimated: >60 mL/min (ref >60)
Glucose, Bld: 92 mg/dL (ref 70–99)
Potassium: 4.3 mmol/L (ref 3.5–5.1)
Sodium: 135 mmol/L (ref 135–145)
Total Bilirubin: 0.4 mg/dL (ref 0.3–1.2)
Total Protein: 6.8 g/dL (ref 6.5–8.1)

## 2022-12-17 LAB — CBC
HCT: 41.5 % (ref 39.0–52.0)
Hemoglobin: 14.2 g/dL (ref 13.0–17.0)
MCH: 31.1 pg (ref 26.0–34.0)
MCHC: 34.2 g/dL (ref 30.0–36.0)
MCV: 91 fL (ref 80.0–100.0)
Platelets: 200 10*3/uL (ref 150–400)
RBC: 4.56 MIL/uL (ref 4.22–5.81)
RDW: 14.6 % (ref 11.5–15.5)
WBC: 6.2 10*3/uL (ref 4.0–10.5)
nRBC: 0 % (ref 0.0–0.2)

## 2022-12-17 LAB — RESP PANEL BY RT-PCR (RSV, FLU A&B, COVID)  RVPGX2
Influenza A by PCR: NEGATIVE
Influenza B by PCR: NEGATIVE
Resp Syncytial Virus by PCR: NEGATIVE
SARS Coronavirus 2 by RT PCR: NEGATIVE

## 2022-12-17 LAB — RAPID URINE DRUG SCREEN, HOSP PERFORMED
Amphetamines: NOT DETECTED
Barbiturates: NOT DETECTED
Benzodiazepines: NOT DETECTED
Cocaine: NOT DETECTED
Opiates: NOT DETECTED
Tetrahydrocannabinol: POSITIVE — AB

## 2022-12-17 LAB — TSH: TSH: 1.881 u[IU]/mL (ref 0.350–4.500)

## 2022-12-17 LAB — ETHANOL: Alcohol, Ethyl (B): <10 mg/dL (ref <10)

## 2022-12-17 MED ORDER — RAMIPRIL 5 MG PO CAPS
5.0000 mg | ORAL_CAPSULE | Freq: Every day | ORAL | Status: DC
  Filled 2022-12-17 (×2): qty 1

## 2022-12-17 MED ORDER — ALBUTEROL SULFATE (2.5 MG/3ML) 0.083% IN NEBU: 2.5000 mg | INHALATION_SOLUTION | Freq: Four times a day (QID) | RESPIRATORY_TRACT | Status: DC | PRN

## 2022-12-17 MED ORDER — ALBUTEROL SULFATE HFA 108 (90 BASE) MCG/ACT IN AERS: 2.0000 | INHALATION_SPRAY | Freq: Four times a day (QID) | RESPIRATORY_TRACT | Status: DC | PRN

## 2022-12-17 MED ORDER — ROSUVASTATIN CALCIUM 20 MG PO TABS
20.0000 mg | ORAL_TABLET | Freq: Every day | ORAL | Status: DC
  Filled 2022-12-17: qty 1

## 2022-12-17 MED ORDER — METOPROLOL TARTRATE 25 MG PO TABS
25.0000 mg | ORAL_TABLET | Freq: Two times a day (BID) | ORAL | Status: DC
  Filled 2022-12-17: qty 1

## 2022-12-17 NOTE — ED Notes (Signed)
Pt states he needs take his sister to a doctor's appointment @ 1500 today.  That he is not suicidal that his doctor just sent him to get some help because he wasn't sleeping.  MD made aware.

## 2022-12-17 NOTE — Discharge Instructions (Addendum)
Your heart rate was slow today.  Talk with your cardiologist about your metoprolol.
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
## 2022-12-17 NOTE — ED Triage Notes (Signed)
Dr. Alvino Chapel at bedside for [facility]

## 2022-12-17 NOTE — ED Notes (Signed)
Face to face with MD to make aware of HR. Order for EKG; no other orders at this time.

## 2022-12-17 NOTE — ED Notes (Signed)
Patient changed into paper scrubs, clothes, phone & boots in pt locker.  Security wanded pt, took possession of $90 inside a camo colored wallet & pocket knife.

## 2022-12-17 NOTE — ED Triage Notes (Signed)
Patient sent from [facility] internal medicine. Patient states he has stopped taking his Latuda because he felt like it was causing him to have suicidal thoughts. He had been on it for 3 years. Recently hasn't been following up with his mental health provider either.  Patient states that he needs help getting his medicines regulated because he just needs rest however he can get it. States he wishes to live but has not slept in 5 days and he can no longer continue like this. Hx of Bipolar. Hx of suicide attempt.

## 2022-12-26 ENCOUNTER — Encounter: Payer: Self-pay | Admitting: Internal Medicine

## 2023-01-01 DIAGNOSIS — Z299 Encounter for prophylactic measures, unspecified: Secondary | ICD-10-CM | POA: Diagnosis not present

## 2023-01-01 DIAGNOSIS — I10 Essential (primary) hypertension: Secondary | ICD-10-CM | POA: Diagnosis not present

## 2023-01-01 DIAGNOSIS — G629 Polyneuropathy, unspecified: Secondary | ICD-10-CM | POA: Diagnosis not present

## 2023-01-01 DIAGNOSIS — F17210 Nicotine dependence, cigarettes, uncomplicated: Secondary | ICD-10-CM | POA: Diagnosis not present

## 2023-01-05 ENCOUNTER — Institutional Professional Consult (permissible substitution): Payer: Medicaid Other | Admitting: Pulmonary Disease

## 2023-01-12 DIAGNOSIS — Z299 Encounter for prophylactic measures, unspecified: Secondary | ICD-10-CM | POA: Diagnosis not present

## 2023-01-12 DIAGNOSIS — J449 Chronic obstructive pulmonary disease, unspecified: Secondary | ICD-10-CM | POA: Diagnosis not present

## 2023-01-12 DIAGNOSIS — F17210 Nicotine dependence, cigarettes, uncomplicated: Secondary | ICD-10-CM | POA: Diagnosis not present

## 2023-01-12 DIAGNOSIS — M549 Dorsalgia, unspecified: Secondary | ICD-10-CM | POA: Diagnosis not present

## 2023-01-12 DIAGNOSIS — R911 Solitary pulmonary nodule: Secondary | ICD-10-CM | POA: Diagnosis not present

## 2023-01-14 DIAGNOSIS — Z79899 Other long term (current) drug therapy: Secondary | ICD-10-CM | POA: Diagnosis not present

## 2023-01-14 DIAGNOSIS — Z0000 Encounter for general adult medical examination without abnormal findings: Secondary | ICD-10-CM | POA: Diagnosis not present

## 2023-01-14 DIAGNOSIS — E7800 Pure hypercholesterolemia, unspecified: Secondary | ICD-10-CM | POA: Diagnosis not present

## 2023-01-15 DIAGNOSIS — I8002 Phlebitis and thrombophlebitis of superficial vessels of left lower extremity: Secondary | ICD-10-CM | POA: Diagnosis not present

## 2023-01-15 DIAGNOSIS — Z299 Encounter for prophylactic measures, unspecified: Secondary | ICD-10-CM | POA: Diagnosis not present

## 2023-01-15 DIAGNOSIS — I808 Phlebitis and thrombophlebitis of other sites: Secondary | ICD-10-CM | POA: Diagnosis not present

## 2023-01-15 DIAGNOSIS — I10 Essential (primary) hypertension: Secondary | ICD-10-CM | POA: Diagnosis not present

## 2023-01-15 DIAGNOSIS — I8001 Phlebitis and thrombophlebitis of superficial vessels of right lower extremity: Secondary | ICD-10-CM | POA: Diagnosis not present

## 2023-01-29 ENCOUNTER — Ambulatory Visit: Payer: 59 | Attending: Internal Medicine | Admitting: Internal Medicine

## 2023-01-29 NOTE — Progress Notes (Signed)
Erroneous encounter - please disregard.

## 2023-02-02 DIAGNOSIS — I808 Phlebitis and thrombophlebitis of other sites: Secondary | ICD-10-CM | POA: Diagnosis not present

## 2023-02-02 DIAGNOSIS — I8001 Phlebitis and thrombophlebitis of superficial vessels of right lower extremity: Secondary | ICD-10-CM | POA: Diagnosis not present

## 2023-02-09 ENCOUNTER — Ambulatory Visit: Payer: Medicaid Other | Admitting: Internal Medicine
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
## 2023-02-10 ENCOUNTER — Encounter: Payer: Self-pay | Admitting: Internal Medicine

## 2023-02-10 ENCOUNTER — Ambulatory Visit (INDEPENDENT_AMBULATORY_CARE_PROVIDER_SITE_OTHER): Payer: 59 | Admitting: Internal Medicine

## 2023-02-10 VITALS — BP 130/74 | HR 61 | Ht 67.0 in | Wt 139.0 lb

## 2023-02-10 DIAGNOSIS — R0609 Other forms of dyspnea: Secondary | ICD-10-CM | POA: Diagnosis not present

## 2023-02-10 DIAGNOSIS — F17210 Nicotine dependence, cigarettes, uncomplicated: Secondary | ICD-10-CM | POA: Diagnosis not present

## 2023-02-10 NOTE — Assessment & Plan Note (Addendum)
LDSCT  done 02/2022 was abn > rec restart screening program   Low-dose CT lung cancer screening is recommended for patients who are 86-66 years of age with a 20+ pack-year history of smoking and who are currently smoking or quit <=15 years ago. No coughing up blood  No unintentional weight loss of > 15 pounds in the last 6 months - pt is eligible for scanning yearly until 2039  / age 27  > to be placed in program p f/u study completed   Discussed in detail all the  indications, usual  risks and alternatives  relative to the benefits with patient who agrees to proceed with w/u as outlined.         Each maintenance medication was reviewed in detail including emphasizing most importantly the difference between maintenance and prns and under what circumstances the prns are to be triggered using an action plan format where appropriate.  Total time for H and P, chart review, counseling, reviewing hfa device(s) , directly observing portions of ambulatory 02 saturation study/ and generating customized AVS unique to this office visit / same day charting  > 45 min new pt eval

## 2023-02-10 NOTE — Assessment & Plan Note (Addendum)
Active smoker - Chest CT cuts on coronary study    11/10/22 :  Centrilobular emphysema - Labs ordered 02/10/2023  :  allergy profile  alpha one AT phenotype   - 02/10/2023  After extensive coaching inhaler device,  effectiveness =    75% hfa > continue symbicort 160 up to 2 bid for AB  - 02/10/2023   Walked on RA   x  3  lap(s) =  approx 450  ft  @ mod fast pace, stopped due to end of study with lowest 02 sats 98%    Clinically relatively mild copd/ AB so symb 160 up to 2 bid should be adequate  Re SABA :  I spent extra time with pt today reviewing appropriate use of albuterol for prn use on exertion with the following points: 1) saba is for relief of sob that does not improve by walking a slower pace or resting but rather if the pt does not improve after trying this first. 2) If the pt is convinced, as many are, that saba helps recover from activity faster then it's easy to tell if this is the case by re-challenging : ie stop, take the inhaler, then p 5 minutes try the exact same activity (intensity of workload) that just caused the symptoms and see if they are substantially diminished or not after saba 3) if there is an activity that reproducibly causes the symptoms, try the saba 15 min before the activity on alternate days   If in fact the saba really does help, then fine to continue to use it prn but advised may need to look closer at the maintenance regimen being used to achieve better control of airways disease with exertion.

## 2023-02-10 NOTE — Patient Instructions (Addendum)
My office will be contacting you by phone for referral to lung nodule follow up and screening clinic.  - if you don't hear back from my office within one week please call us back or notify us thru MyChart and we'll address it right away.   Continue symbicort 160 2 each am  - another 2 puffs 12 hours later if not doing great and needing more than usual albuterol   Only use your albuterol as a rescue medication to be used if you can't catch your breath by resting or doing a relaxed purse lip breathing pattern.  - The less you use it, the better it will work when you need it. - Ok to use up to 2 puffs  every 4 hours if you must but call for immediate appointment if use goes up over your usual need - Don't leave home without it !!  (think of it like the spare tire for your car)   Also  Ok to try albuterol x 2 puffs x 15 min before an activity (on alternating days)  that you know would usually make you short of breath and see if it makes any difference and if makes none then don't take albuterol after activity unless you can't catch your breath as this means it's the resting that helps, not the albuterol.  Please remember to go to the lab department   for your tests - we will call you with the results when they are available.      The key is to stop smoking completely before smoking completely stops you!  Please schedule a follow up visit in 3 months but call sooner if needed with pfts on return

## 2023-02-13 ENCOUNTER — Other Ambulatory Visit: Payer: Self-pay

## 2023-02-13 DIAGNOSIS — Z87891 Personal history of nicotine dependence: Secondary | ICD-10-CM

## 2023-02-13 DIAGNOSIS — F17210 Nicotine dependence, cigarettes, uncomplicated: Secondary | ICD-10-CM

## 2023-02-16 LAB — CBC WITH DIFFERENTIAL/PLATELET
Basophils Absolute: 0.1 10*3/uL (ref 0.0–0.2)
Basos: 1 %
EOS (ABSOLUTE): 0.2 10*3/uL (ref 0.0–0.4)
Eos: 3 %
Hematocrit: 42.1 % (ref 37.5–51.0)
Hemoglobin: 14.2 g/dL (ref 13.0–17.7)
Immature Grans (Abs): 0 10*3/uL (ref 0.0–0.1)
Immature Granulocytes: 0 %
Lymphocytes Absolute: 2.5 10*3/uL (ref 0.7–3.1)
Lymphs: 38 %
MCH: 31 pg (ref 26.6–33.0)
MCHC: 33.7 g/dL (ref 31.5–35.7)
MCV: 92 fL (ref 79–97)
Monocytes Absolute: 0.6 10*3/uL (ref 0.1–0.9)
Monocytes: 9 %
Neutrophils Absolute: 3.2 10*3/uL (ref 1.4–7.0)
Neutrophils: 49 %
Platelets: 243 10*3/uL (ref 150–450)
RBC: 4.58 x10E6/uL (ref 4.14–5.80)
RDW: 14.5 % (ref 11.6–15.4)
WBC: 6.4 10*3/uL (ref 3.4–10.8)

## 2023-02-16 LAB — ALPHA-1-ANTITRYPSIN PHENOTYP: A-1 Antitrypsin: 187 mg/dL (ref 101–187)

## 2023-04-01 ENCOUNTER — Encounter: Payer: Self-pay | Admitting: Acute Care

## 2023-04-01 ENCOUNTER — Ambulatory Visit (INDEPENDENT_AMBULATORY_CARE_PROVIDER_SITE_OTHER): Payer: 59 | Admitting: Acute Care

## 2023-04-01 DIAGNOSIS — Z87891 Personal history of nicotine dependence: Secondary | ICD-10-CM

## 2023-04-01 NOTE — Progress Notes (Addendum)
Virtual Visit via Telephone Note  I connected with Johnathan Barber on 04/01/23 at 10:00 AM EDT by telephone and verified that I am speaking with the correct person using two identifiers.  Location: Patient:  At home Provider:  [address] [place], [place], [address]    I discussed the limitations, risks, security and privacy concerns of performing an evaluation and management service by telephone and the availability of in person appointments. I also discussed with the patient that there may be a patient responsible charge related to this service. The patient expressed understanding and agreed to proceed.    Shared Decision Making Visit Lung Cancer Screening Program [phone])   Eligibility: Age 67 y.o. Pack Years Smoking History Calculation 49 pack year smoking history (# packs/per year x # years smoked) Recent History of coughing up blood  no Unexplained weight loss? no ( >Than 15 pounds within the last 6 months ) Prior History Lung / other cancer no (Diagnosis within the last 5 years already requiring surveillance chest CT Scans). Smoking Status Former Smoker Former Smokers: Years since quit: < 1 year>> Quit 3 months ago  Quit Date: 03/11/2023  Visit Components: Discussion included one or more decision making aids. yes Discussion included risk/benefits of screening. yes Discussion included potential follow up diagnostic testing for abnormal scans. yes Discussion included meaning and risk of over diagnosis. yes Discussion included meaning and risk of False Positives. yes Discussion included meaning of total radiation exposure. yes  Counseling Included: Importance of adherence to annual lung cancer LDCT screening. yes Impact of comorbidities on ability to participate in the program. yes Ability and willingness to under diagnostic treatment. yes  Smoking Cessation Counseling: Current Smokers:  Discussed importance of smoking cessation. no Information about tobacco  cessation classes and interventions provided to patient. no Patient provided with "ticket" for LDCT Scan. yes Symptomatic Patient. no  Counseling NA Diagnosis Code: Tobacco Use Z72.0 Asymptomatic Patient yes  Counseling (Intermediate counseling: > three minutes counseling) U0454 Former Smokers:  Discussed the importance of maintaining cigarette abstinence. yes Diagnosis Code: Personal History of Nicotine Dependence. U98.119 Information about tobacco cessation classes and interventions provided to patient. Yes Patient provided with "ticket" for LDCT Scan. yes Written Order for Lung Cancer Screening with LDCT placed in Epic. Yes (CT Chest Lung Cancer Screening Low Dose W/O CM) [id number] Z12.2-Screening of respiratory organs Z87.891-Personal history of nicotine dependence  I spent 25 minutes of face to face time/virtual visit time  with  Johnathan Barber discussing the risks and benefits of lung cancer screening. We took the time to pause the power point at intervals to allow for questions to be asked and answered to ensure understanding. We discussed that he had taken the single most powerful action possible to decrease his risk of developing lung cancer when he quit smoking. I counseled him to remain smoke free, and to contact me if he ever had the desire to smoke again so that I can provide resources and tools to help support the effort to remain smoke free. We discussed the time and location of the scan, and that either  Abigail Miyamoto RN, Karlton Lemon, RN or I  or I will call / send a letter with the results within  24-72 hours of receiving them. He has the office contact information in the event he needs to speak with me,  He verbalized understanding of all of the above and had no further questions upon leaving the office.     I explained  to the patient that there has been a high incidence of coronary artery disease noted on these exams. I explained that this is a non-gated exam therefore degree or  severity cannot be determined. This patient is not on statin therapy. I have asked the patient to follow-up with their PCP regarding any incidental finding of coronary artery disease and management with diet or medication as they feel is clinically indicated. The patient verbalized understanding of the above and had no further questions.    Bevelyn Ngo, NP 04/01/2023

## 2023-04-01 NOTE — Patient Instructions (Signed)
Thank you for participating in the [facility]. It was our pleasure to meet you today. We will call you with the results of your scan within the next few days. Your scan will be assigned a Lung RADS category score by the physicians reading the scans.  This Lung RADS score determines follow up scanning.  See below for description of categories, and follow up screening recommendations. We will be in touch to schedule your follow up screening annually or based on recommendations of our providers. We will fax a copy of your scan results to your Primary Care Physician, or the physician who referred you to the program, to ensure they have the results. Please call the office if you have any questions or concerns regarding your scanning experience or results.  Our office number is [phone]. Please speak with Denise Phelps, RN. , or  Denise Buckner RN, They are  our Lung Cancer Screening RN.'s If They are unavailable when you call, Please leave a message on the voice mail. We will return your call at our earliest convenience.This voice mail is monitored several times a day.  Remember, if your scan is normal, we will scan you annually as long as you continue to meet the criteria for the program. (Age 50-80, Current smoker or smoker who has quit within the last 15 years). If you are a smoker, remember, quitting is the single most powerful action that you can take to decrease your risk of lung cancer and other pulmonary, breathing related problems. We know quitting is hard, and we are here to help.  Please let us know if there is anything we can do to help you meet your goal of quitting. If you are a former smoker, congratulations. We are proud of you! Remain smoke free! Remember you can refer friends or family members through the number above.  We will screen them to make sure they meet criteria for the program. Thank you for helping us take better care of you by  participating in Lung Screening.  You can receive free nicotine replacement therapy ( patches, gum or mints) by calling 1-800-QUIT NOW. Please call so we can get you on the path to becoming  a non-smoker. I know it is hard, but you can do this!  Lung RADS Categories:  Lung RADS 1: no nodules or definitely non-concerning nodules.  Recommendation is for a repeat annual scan in 12 months.  Lung RADS 2:  nodules that are non-concerning in appearance and behavior with a very low likelihood of becoming an active cancer. Recommendation is for a repeat annual scan in 12 months.  Lung RADS 3: nodules that are probably non-concerning , includes nodules with a low likelihood of becoming an active cancer.  Recommendation is for a 6-month repeat screening scan. Often noted after an upper respiratory illness. We will be in touch to make sure you have no questions, and to schedule your 6-month scan.  Lung RADS 4 A: nodules with concerning findings, recommendation is most often for a follow up scan in 3 months or additional testing based on our provider's assessment of the scan. We will be in touch to make sure you have no questions and to schedule the recommended 3 month follow up scan.  Lung RADS 4 B:  indicates findings that are concerning. We will be in touch with you to schedule additional diagnostic testing based on our provider's  assessment of the scan.  Other options for assistance in smoking cessation (   As covered by your insurance benefits)  Hypnosis for smoking cessation  [employer] [phone]  Acupuncture for smoking cessation  [facility] [phone]   

## 2023-04-07 ENCOUNTER — Ambulatory Visit (HOSPITAL_COMMUNITY): Payer: 59

## 2023-04-18 DIAGNOSIS — H10013 Acute follicular conjunctivitis, bilateral: Secondary | ICD-10-CM | POA: Diagnosis not present

## 2023-04-21 ENCOUNTER — Ambulatory Visit (HOSPITAL_COMMUNITY): Admission: RE | Admit: 2023-04-21 | Payer: 59 | Source: Ambulatory Visit

## 2023-04-21 ENCOUNTER — Ambulatory Visit (HOSPITAL_COMMUNITY)
Admission: RE | Admit: 2023-04-21 | Discharge: 2023-04-21 | Disposition: A | Discharge status: Home / Self Care | Payer: 59 | Source: Ambulatory Visit | Attending: Acute Care | Admitting: Acute Care

## 2023-04-21 DIAGNOSIS — Z87891 Personal history of nicotine dependence: Secondary | ICD-10-CM | POA: Diagnosis not present

## 2023-04-21 DIAGNOSIS — F17210 Nicotine dependence, cigarettes, uncomplicated: Secondary | ICD-10-CM | POA: Insufficient documentation

## 2023-04-22 DIAGNOSIS — D045 Carcinoma in situ of skin of trunk: Secondary | ICD-10-CM | POA: Diagnosis not present

## 2023-04-22 DIAGNOSIS — D225 Melanocytic nevi of trunk: Secondary | ICD-10-CM | POA: Diagnosis not present

## 2023-04-22 DIAGNOSIS — D0461 Carcinoma in situ of skin of right upper limb, including shoulder: Secondary | ICD-10-CM | POA: Diagnosis not present

## 2023-04-22 DIAGNOSIS — B078 Other viral warts: Secondary | ICD-10-CM | POA: Diagnosis not present

## 2023-04-22 DIAGNOSIS — D044 Carcinoma in situ of skin of scalp and neck: Secondary | ICD-10-CM | POA: Diagnosis not present

## 2023-04-22 DIAGNOSIS — Z1283 Encounter for screening for malignant neoplasm of skin: Secondary | ICD-10-CM | POA: Diagnosis not present

## 2023-04-22 DIAGNOSIS — D485 Neoplasm of uncertain behavior of skin: Secondary | ICD-10-CM | POA: Diagnosis not present

## 2023-04-24 ENCOUNTER — Other Ambulatory Visit: Payer: Self-pay

## 2023-04-24 DIAGNOSIS — F17210 Nicotine dependence, cigarettes, uncomplicated: Secondary | ICD-10-CM

## 2023-04-24 DIAGNOSIS — Z87891 Personal history of nicotine dependence: Secondary | ICD-10-CM
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
## 2023-05-14 ENCOUNTER — Ambulatory Visit: Payer: 59 | Admitting: Internal Medicine

## 2023-05-25 DIAGNOSIS — D485 Neoplasm of uncertain behavior of skin: Secondary | ICD-10-CM | POA: Diagnosis not present

## 2023-05-25 DIAGNOSIS — Z85828 Personal history of other malignant neoplasm of skin: Secondary | ICD-10-CM | POA: Diagnosis not present

## 2023-05-25 DIAGNOSIS — Z08 Encounter for follow-up examination after completed treatment for malignant neoplasm: Secondary | ICD-10-CM | POA: Diagnosis not present

## 2023-05-25 DIAGNOSIS — B360 Pityriasis versicolor: Secondary | ICD-10-CM | POA: Diagnosis not present

## 2023-05-25 DIAGNOSIS — D225 Melanocytic nevi of trunk: Secondary | ICD-10-CM | POA: Diagnosis not present
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
## 2023-06-09 ENCOUNTER — Ambulatory Visit: Payer: 59 | Admitting: Internal Medicine

## 2023-06-09 ENCOUNTER — Ambulatory Visit (HOSPITAL_COMMUNITY): Admission: RE | Admit: 2023-06-09 | Payer: 59 | Source: Ambulatory Visit

## 2023-06-09 ENCOUNTER — Encounter: Payer: Self-pay | Admitting: Internal Medicine

## 2023-06-10 ENCOUNTER — Other Ambulatory Visit (HOSPITAL_COMMUNITY): Payer: Self-pay | Admitting: Family Medicine

## 2023-06-10 ENCOUNTER — Ambulatory Visit (HOSPITAL_COMMUNITY)
Admission: RE | Admit: 2023-06-10 | Discharge: 2023-06-10 | Disposition: A | Discharge status: Home / Self Care | Payer: 59 | Source: Ambulatory Visit | Attending: Family Medicine | Admitting: Family Medicine

## 2023-06-10 DIAGNOSIS — M79609 Pain in unspecified limb: Secondary | ICD-10-CM | POA: Insufficient documentation

## 2023-06-10 DIAGNOSIS — M79601 Pain in right arm: Secondary | ICD-10-CM | POA: Diagnosis not present

## 2023-06-10 DIAGNOSIS — I10 Essential (primary) hypertension: Secondary | ICD-10-CM | POA: Diagnosis not present

## 2023-06-10 DIAGNOSIS — I808 Phlebitis and thrombophlebitis of other sites: Secondary | ICD-10-CM | POA: Diagnosis not present

## 2023-06-10 DIAGNOSIS — R2231 Localized swelling, mass and lump, right upper limb: Secondary | ICD-10-CM | POA: Diagnosis not present

## 2023-06-10 DIAGNOSIS — I700 Atherosclerosis of aorta: Secondary | ICD-10-CM | POA: Diagnosis not present

## 2023-06-10 DIAGNOSIS — Z299 Encounter for prophylactic measures, unspecified: Secondary | ICD-10-CM | POA: Diagnosis not present

## 2023-06-10 DIAGNOSIS — F17210 Nicotine dependence, cigarettes, uncomplicated: Secondary | ICD-10-CM | POA: Diagnosis not present

## 2023-08-27 DIAGNOSIS — R251 Tremor, unspecified: Secondary | ICD-10-CM | POA: Diagnosis not present

## 2023-08-27 DIAGNOSIS — M62830 Muscle spasm of back: Secondary | ICD-10-CM | POA: Diagnosis not present

## 2023-08-27 DIAGNOSIS — Z299 Encounter for prophylactic measures, unspecified: Secondary | ICD-10-CM | POA: Diagnosis not present

## 2023-08-27 DIAGNOSIS — R52 Pain, unspecified: Secondary | ICD-10-CM | POA: Diagnosis not present

## 2023-08-27 DIAGNOSIS — M549 Dorsalgia, unspecified: Secondary | ICD-10-CM | POA: Diagnosis not present

## 2023-08-27 DIAGNOSIS — G629 Polyneuropathy, unspecified: Secondary | ICD-10-CM | POA: Diagnosis not present

## 2023-09-10 DIAGNOSIS — M19011 Primary osteoarthritis, right shoulder: Secondary | ICD-10-CM | POA: Diagnosis not present

## 2023-09-10 DIAGNOSIS — Z0000 Encounter for general adult medical examination without abnormal findings: Secondary | ICD-10-CM | POA: Diagnosis not present

## 2023-09-10 DIAGNOSIS — F17210 Nicotine dependence, cigarettes, uncomplicated: Secondary | ICD-10-CM | POA: Diagnosis not present

## 2023-09-10 DIAGNOSIS — Z7189 Other specified counseling: Secondary | ICD-10-CM | POA: Diagnosis not present

## 2023-09-10 DIAGNOSIS — Z299 Encounter for prophylactic measures, unspecified: Secondary | ICD-10-CM | POA: Diagnosis not present

## 2023-09-10 DIAGNOSIS — M25511 Pain in right shoulder: Secondary | ICD-10-CM | POA: Diagnosis not present

## 2023-09-10 DIAGNOSIS — T1490XA Injury, unspecified, initial encounter: Secondary | ICD-10-CM | POA: Diagnosis not present

## 2023-09-10 DIAGNOSIS — R5383 Other fatigue: Secondary | ICD-10-CM | POA: Diagnosis not present

## 2023-09-10 DIAGNOSIS — E7800 Pure hypercholesterolemia, unspecified: Secondary | ICD-10-CM | POA: Diagnosis not present

## 2023-09-10 DIAGNOSIS — I10 Essential (primary) hypertension: Secondary | ICD-10-CM | POA: Diagnosis not present

## 2023-11-24 DIAGNOSIS — R350 Frequency of micturition: Secondary | ICD-10-CM | POA: Diagnosis not present

## 2023-11-24 DIAGNOSIS — G43909 Migraine, unspecified, not intractable, without status migrainosus: Secondary | ICD-10-CM | POA: Diagnosis not present

## 2023-11-24 DIAGNOSIS — Z299 Encounter for prophylactic measures, unspecified: Secondary | ICD-10-CM | POA: Diagnosis not present

## 2023-11-24 DIAGNOSIS — F17210 Nicotine dependence, cigarettes, uncomplicated: Secondary | ICD-10-CM | POA: Diagnosis not present

## 2023-11-24 DIAGNOSIS — N451 Epididymitis: Secondary | ICD-10-CM | POA: Diagnosis not present

## 2023-11-24 DIAGNOSIS — J449 Chronic obstructive pulmonary disease, unspecified: Secondary | ICD-10-CM | POA: Diagnosis not present

## 2023-11-24 DIAGNOSIS — I10 Essential (primary) hypertension: Secondary | ICD-10-CM | POA: Diagnosis not present

## 2023-11-25 DIAGNOSIS — R413 Other amnesia: Secondary | ICD-10-CM | POA: Diagnosis not present

## 2023-12-03 DIAGNOSIS — R569 Unspecified convulsions: Secondary | ICD-10-CM | POA: Diagnosis not present

## 2023-12-03 DIAGNOSIS — G43909 Migraine, unspecified, not intractable, without status migrainosus: Secondary | ICD-10-CM | POA: Diagnosis not present

## 2023-12-03 DIAGNOSIS — R42 Dizziness and giddiness: Secondary | ICD-10-CM | POA: Diagnosis not present

## 2023-12-03 DIAGNOSIS — R413 Other amnesia: Secondary | ICD-10-CM | POA: Diagnosis not present

## 2023-12-03 DIAGNOSIS — R519 Headache, unspecified: Secondary | ICD-10-CM | POA: Diagnosis not present

## 2023-12-03 DIAGNOSIS — R5383 Other fatigue: Secondary | ICD-10-CM | POA: Diagnosis not present

## 2023-12-03 DIAGNOSIS — I6782 Cerebral ischemia: Secondary | ICD-10-CM | POA: Diagnosis not present

## 2023-12-16 DIAGNOSIS — G629 Polyneuropathy, unspecified: Secondary | ICD-10-CM | POA: Diagnosis not present

## 2023-12-16 DIAGNOSIS — Z299 Encounter for prophylactic measures, unspecified: Secondary | ICD-10-CM | POA: Diagnosis not present

## 2023-12-16 DIAGNOSIS — I700 Atherosclerosis of aorta: Secondary | ICD-10-CM | POA: Diagnosis not present

## 2023-12-16 DIAGNOSIS — I739 Peripheral vascular disease, unspecified: Secondary | ICD-10-CM | POA: Diagnosis not present

## 2023-12-16 DIAGNOSIS — I10 Essential (primary) hypertension: Secondary | ICD-10-CM | POA: Diagnosis not present

## 2023-12-30 DIAGNOSIS — I639 Cerebral infarction, unspecified: Secondary | ICD-10-CM | POA: Diagnosis not present

## 2023-12-30 DIAGNOSIS — Z8669 Personal history of other diseases of the nervous system and sense organs: Secondary | ICD-10-CM | POA: Diagnosis not present

## 2023-12-30 DIAGNOSIS — E785 Hyperlipidemia, unspecified: Secondary | ICD-10-CM | POA: Diagnosis not present

## 2023-12-30 DIAGNOSIS — G250 Essential tremor: Secondary | ICD-10-CM | POA: Diagnosis not present

## 2023-12-31 DIAGNOSIS — I2510 Atherosclerotic heart disease of native coronary artery without angina pectoris: Secondary | ICD-10-CM | POA: Diagnosis not present

## 2023-12-31 DIAGNOSIS — G4700 Insomnia, unspecified: Secondary | ICD-10-CM | POA: Diagnosis not present

## 2023-12-31 DIAGNOSIS — Z299 Encounter for prophylactic measures, unspecified: Secondary | ICD-10-CM | POA: Diagnosis not present

## 2023-12-31 DIAGNOSIS — K5900 Constipation, unspecified: Secondary | ICD-10-CM | POA: Diagnosis not present

## 2023-12-31 DIAGNOSIS — R0683 Snoring: Secondary | ICD-10-CM | POA: Diagnosis not present

## 2023-12-31 DIAGNOSIS — I10 Essential (primary) hypertension: Secondary | ICD-10-CM | POA: Diagnosis not present

## 2024-01-14 DIAGNOSIS — I671 Cerebral aneurysm, nonruptured: Secondary | ICD-10-CM | POA: Diagnosis not present

## 2024-01-14 DIAGNOSIS — I639 Cerebral infarction, unspecified: Secondary | ICD-10-CM | POA: Diagnosis not present

## 2024-01-14 DIAGNOSIS — Z8673 Personal history of transient ischemic attack (TIA), and cerebral infarction without residual deficits: Secondary | ICD-10-CM | POA: Diagnosis not present

## 2024-01-22 DIAGNOSIS — G43909 Migraine, unspecified, not intractable, without status migrainosus: Secondary | ICD-10-CM | POA: Diagnosis not present

## 2024-01-22 DIAGNOSIS — Z299 Encounter for prophylactic measures, unspecified: Secondary | ICD-10-CM | POA: Diagnosis not present

## 2024-01-22 DIAGNOSIS — I10 Essential (primary) hypertension: Secondary | ICD-10-CM | POA: Diagnosis not present

## 2024-01-22 DIAGNOSIS — R52 Pain, unspecified: Secondary | ICD-10-CM | POA: Diagnosis not present

## 2024-01-22 DIAGNOSIS — I729 Aneurysm of unspecified site: Secondary | ICD-10-CM | POA: Diagnosis not present

## 2024-02-01 DIAGNOSIS — G629 Polyneuropathy, unspecified: Secondary | ICD-10-CM | POA: Diagnosis not present

## 2024-02-01 DIAGNOSIS — I2510 Atherosclerotic heart disease of native coronary artery without angina pectoris: Secondary | ICD-10-CM | POA: Diagnosis not present

## 2024-02-01 DIAGNOSIS — I10 Essential (primary) hypertension: Secondary | ICD-10-CM | POA: Diagnosis not present

## 2024-02-01 DIAGNOSIS — Z299 Encounter for prophylactic measures, unspecified: Secondary | ICD-10-CM | POA: Diagnosis not present

## 2024-02-01 DIAGNOSIS — M62830 Muscle spasm of back: Secondary | ICD-10-CM | POA: Diagnosis not present

## 2024-02-22 DIAGNOSIS — G5603 Carpal tunnel syndrome, bilateral upper limbs: Secondary | ICD-10-CM | POA: Diagnosis not present

## 2024-02-22 DIAGNOSIS — I10 Essential (primary) hypertension: Secondary | ICD-10-CM | POA: Diagnosis not present

## 2024-02-22 DIAGNOSIS — Z299 Encounter for prophylactic measures, unspecified: Secondary | ICD-10-CM | POA: Diagnosis not present

## 2024-03-07 DIAGNOSIS — M2569 Stiffness of other specified joint, not elsewhere classified: Secondary | ICD-10-CM | POA: Diagnosis not present

## 2024-03-07 DIAGNOSIS — M25549 Pain in joints of unspecified hand: Secondary | ICD-10-CM | POA: Diagnosis not present

## 2024-03-07 DIAGNOSIS — M6281 Muscle weakness (generalized): Secondary | ICD-10-CM | POA: Diagnosis not present

## 2024-03-07 DIAGNOSIS — G5601 Carpal tunnel syndrome, right upper limb: Secondary | ICD-10-CM | POA: Diagnosis not present

## 2024-03-09 DIAGNOSIS — R569 Unspecified convulsions: Secondary | ICD-10-CM | POA: Diagnosis not present

## 2024-03-09 DIAGNOSIS — R55 Syncope and collapse: Secondary | ICD-10-CM | POA: Diagnosis not present

## 2024-03-09 DIAGNOSIS — I081 Rheumatic disorders of both mitral and tricuspid valves: Secondary | ICD-10-CM | POA: Diagnosis not present

## 2024-03-09 DIAGNOSIS — I671 Cerebral aneurysm, nonruptured: Secondary | ICD-10-CM | POA: Diagnosis not present

## 2024-03-09 DIAGNOSIS — Z8673 Personal history of transient ischemic attack (TIA), and cerebral infarction without residual deficits: Secondary | ICD-10-CM | POA: Diagnosis not present

## 2024-03-09 DIAGNOSIS — I639 Cerebral infarction, unspecified: Secondary | ICD-10-CM | POA: Diagnosis not present

## 2024-03-09 DIAGNOSIS — R001 Bradycardia, unspecified: Secondary | ICD-10-CM | POA: Diagnosis not present

## 2024-03-10 DIAGNOSIS — I441 Atrioventricular block, second degree: Secondary | ICD-10-CM | POA: Diagnosis not present

## 2024-03-28 DIAGNOSIS — I441 Atrioventricular block, second degree: Secondary | ICD-10-CM | POA: Diagnosis not present

## 2024-03-28 DIAGNOSIS — I491 Atrial premature depolarization: Secondary | ICD-10-CM | POA: Diagnosis not present

## 2024-03-28 DIAGNOSIS — R001 Bradycardia, unspecified: Secondary | ICD-10-CM | POA: Diagnosis not present

## 2024-04-21 ENCOUNTER — Ambulatory Visit (HOSPITAL_COMMUNITY): Admission: RE | Admit: 2024-04-21 | Source: Ambulatory Visit

## 2024-05-25 ENCOUNTER — Telehealth: Payer: Self-pay

## 2024-05-25 NOTE — Progress Notes (Signed)
   05/25/2024  Patient ID: Johnathan Barber, male   DOB: [birth year], 68 y.o.   MRN: [id number]  Contacted patient regarding quality report for medication adherence from Maree Isles, MD .   I was unable to reach the patient today. I will try again in 2 days.   Heather Factor, PharmD Clinical Pharmacist  [phone]

## 2024-06-22 NOTE — Progress Notes (Signed)
   06/22/2024  Patient ID: Johnathan Barber, male   DOB: 14-Nov-1955, 68 y.o.   MRN: [id number]  Contacted patient regarding quality report for medication adherence from Maree Isles, MD .   I spoke to the patient today, he declined refilling his atorvastatin. Pt reports that he's having burnout from all the medical visits and medications. I educated the patient on the benefits of the medications, and offered to help make an appointment with his PCP for follow up, but he declined an appointmentt or refills.   I also asked the patient if he wanted a referral for someone to talk to, and additional resources. The patient reports having a counselor, and declined any additional referrals at this time.   I will follow up next week to check in on the patient.   Heather Factor, PharmD Clinical Pharmacist  [phone]
